# Patient Record
Sex: Male | Born: 1975 | Race: White | Hispanic: No | Marital: Single | Smoking: Never smoker
Health system: Southern US, Community
[De-identification: ages and names within clinical notes are randomized; demographics above are authoritative.]

## PROBLEM LIST (undated history)

## (undated) DIAGNOSIS — N200 Calculus of kidney: Secondary | ICD-10-CM

---

## 2016-04-12 ENCOUNTER — Emergency Department (HOSPITAL_COMMUNITY): Payer: BLUE CROSS/BLUE SHIELD

## 2016-04-12 ENCOUNTER — Emergency Department (HOSPITAL_COMMUNITY)
Admission: EM | Admit: 2016-04-12 | Discharge: 2016-04-12 | Disposition: A | Discharge status: Home / Self Care | Payer: BLUE CROSS/BLUE SHIELD | Attending: Emergency Medicine | Admitting: Emergency Medicine

## 2016-04-12 ENCOUNTER — Encounter (HOSPITAL_COMMUNITY): Payer: Self-pay | Admitting: Emergency Medicine

## 2016-04-12 DIAGNOSIS — Z79899 Other long term (current) drug therapy: Secondary | ICD-10-CM | POA: Insufficient documentation

## 2016-04-12 DIAGNOSIS — N200 Calculus of kidney: Secondary | ICD-10-CM | POA: Diagnosis not present

## 2016-04-12 DIAGNOSIS — R109 Unspecified abdominal pain: Secondary | ICD-10-CM | POA: Diagnosis present

## 2016-04-12 HISTORY — DX: Calculus of kidney: N20.0

## 2016-04-12 LAB — URINALYSIS, ROUTINE W REFLEX MICROSCOPIC
Bilirubin Urine: NEGATIVE
GLUCOSE, UA: NEGATIVE mg/dL
Ketones, ur: NEGATIVE mg/dL
LEUKOCYTES UA: NEGATIVE
Nitrite: NEGATIVE
PROTEIN: NEGATIVE mg/dL
SPECIFIC GRAVITY, URINE: 1.029 (ref 1.005–1.030)
pH: 6.5 (ref 5.0–8.0)

## 2016-04-12 LAB — URINE MICROSCOPIC-ADD ON

## 2016-04-12 MED ORDER — KETOROLAC TROMETHAMINE 30 MG/ML IJ SOLN
30.0000 mg | Freq: Once | INTRAMUSCULAR | Status: AC
  Administered 2016-04-12: 30 mg via INTRAVENOUS
  Filled 2016-04-12: qty 1

## 2016-04-12 MED ORDER — OXYCODONE-ACETAMINOPHEN 5-325 MG PO TABS: 1.0000 | ORAL_TABLET | Freq: Three times a day (TID) | ORAL | 0 refills | Status: AC | PRN

## 2016-04-12 MED ORDER — SODIUM CHLORIDE 0.9 % IV BOLUS (SEPSIS)
1000.0000 mL | Freq: Once | INTRAVENOUS | Status: AC
  Administered 2016-04-12: 1000 mL via INTRAVENOUS

## 2016-04-12 MED ORDER — MORPHINE SULFATE (PF) 4 MG/ML IV SOLN
4.0000 mg | Freq: Once | INTRAVENOUS | Status: AC
  Administered 2016-04-12: 4 mg via INTRAVENOUS
  Filled 2016-04-12: qty 1

## 2016-04-12 NOTE — ED Triage Notes (Signed)
Pt reports L flank pain radiating to groin. Hx of kidney stones 13 years ago, feels like same. Having difficulty urinating, but hematuria.

## 2016-04-12 NOTE — Discharge Instructions (Signed)
Please read and follow all provided instructions.  Your diagnoses today include:  1. Left nephrolithiasis    Tests performed today include: Urine test that showed blood in your urine and no infection CT scan which showed a 3 millimeter kidney stone on the left side Vital signs. See below for your results today.   Medications prescribed:   Take any prescribed medications only as directed.  Home care instructions:  Follow any educational materials contained in this packet.  Please double your fluid intake for the next several days. Strain your urine and save any stones that may pass.   BE VERY CAREFUL not to take multiple medicines containing Tylenol (also called acetaminophen). Doing so can lead to an overdose which can damage your liver and cause liver failure and possibly death.   Follow-up instructions: Please follow-up with your urologist or the urologist referral (provided on front page) in the next 1 week for further evaluation of your symptoms.  If you need to return to the Emergency Department, go to [facility] and not [facility]. The [facility]rologists are located at [facility] and can better care for you at this location.  Return instructions:  If you need to return to the Emergency Department, go to [facility] and not [facility]. The [facility]rologists are located at [facility] and can better care for you at this location.  Please return to the Emergency Department if you experience worsening symptoms.  Please return if you develop fever or uncontrolled pain or vomiting. Please return if you have any other emergent concerns.  Additional Information:  Your vital signs today were: BP (!) 174/108 (BP Location: Right Arm)    Pulse 98    Temp 98.2 F (36.8 C) (Oral)    Resp 18    SpO2 100%  If your blood pressure (BP) was elevated above 135/85 this visit, please have this repeated by your doctor within one month. --------------

## 2016-04-12 NOTE — ED Notes (Signed)
PA at bedside.

## 2016-04-12 NOTE — ED Provider Notes (Signed)
[facility]-EMERGENCY DEPT Provider Note   CSN: [id number] Arrival date & time: 04/12/16  1504  History   Chief Complaint Chief Complaint  Patient presents with  . Flank Pain    HPI Caleb Hudson is a 40 y.o. male.  HPI  40 y.o. male with a hx of Kidney Stones, presents to the Emergency Department today complaining of left flank pain this AM. Notes pain is intermittent in nature and rates 5/10. Notes hx similar with stones in past. Has radiation into suprapubic region. Notes decrease urine output as well as minimal hematuria. No fevers. No CP/SOB/ABD pain. No dysuria. Notes nausea and 1 episode of emesis due to pain. Has not taken any OTC remedies. No other symptoms noted.   Past Medical History:  Diagnosis Date  . Kidney stone     There are no active problems to display for this patient.   History reviewed. No pertinent surgical history.     Home Medications    Prior to Admission medications   Not on File    Family History History reviewed. No pertinent family history.  Social History Social History  Substance Use Topics  . Smoking status: Never Smoker  . Smokeless tobacco: Never Used  . Alcohol use No     Allergies   Penicillins   Review of Systems Review of Systems ROS reviewed and all are negative for acute change except as noted in the HPI.  Physical Exam Updated Vital Signs BP (!) 174/108 (BP Location: Right Arm)   Pulse 98   Temp 98.2 F (36.8 C) (Oral)   Resp 18   SpO2 100%   Physical Exam  Constitutional: He is oriented to person, place, and time. Vital signs are normal. He appears well-developed and well-nourished.  HENT:  Head: Normocephalic.  Right Ear: Hearing normal.  Left Ear: Hearing normal.  Eyes: Conjunctivae and EOM are normal. Pupils are equal, round, and reactive to light.  Neck: Normal range of motion. Neck supple.  Cardiovascular: Normal rate and regular rhythm.   Pulmonary/Chest: Effort normal.  Abdominal: Soft. Normal  appearance and bowel sounds are normal. There is no tenderness. There is no rigidity, no rebound, no guarding, no CVA tenderness, no tenderness at McBurney's point and negative Murphy's sign.  Musculoskeletal: Normal range of motion.  Neurological: He is alert and oriented to person, place, and time.  Skin: Skin is warm and dry.  Psychiatric: He has a normal mood and affect. His speech is normal and behavior is normal. Thought content normal.  Nursing note and vitals reviewed.  ED Treatments / Results  Labs (all labs ordered are listed, but only abnormal results are displayed) Labs Reviewed  URINALYSIS, ROUTINE W REFLEX MICROSCOPIC (NOT AT [facility]) - Abnormal; Notable for the following:       Result Value   Hgb urine dipstick SMALL (*)    All other components within normal limits  URINE MICROSCOPIC-ADD ON - Abnormal; Notable for the following:    Squamous Epithelial / LPF 0-5 (*)    Bacteria, UA RARE (*)    All other components within normal limits   EKG  EKG Interpretation None      Radiology Ct Renal Stone Study  Result Date: 04/12/2016 CLINICAL DATA:  Left flank pain EXAM: CT ABDOMEN AND PELVIS WITHOUT CONTRAST TECHNIQUE: Multidetector CT imaging of the abdomen and pelvis was performed following the standard protocol without IV contrast. COMPARISON:  02/28/2002 FINDINGS: Lower chest: Lung bases are unremarkable. Hepatobiliary: Unenhanced liver shows no biliary ductal dilatation.  No calcified gallstones are noted within gallbladder. Pancreas: Unremarkable. No pancreatic ductal dilatation or surrounding inflammatory changes. Spleen: Normal in size without focal abnormality. Adrenals/Urinary Tract: No adrenal gland mass. There is mild left hydronephrosis minimal left hydroureter. Bilateral no nephrolithiasis. No proximal or mid ureteral calcified calculi are noted bilaterally. There is mild left perinephric and periureteral stranding. Axial image 82 there is 3 mm calcified calculus in left  UVJ/ urinary bladder wall. Limited assessment of urinary bladder which is empty. Stomach/Bowel: No small bowel obstruction. No thickened or dilated small bowel loops. Terminal ileum is unremarkable. No pericecal inflammation. Normal appendix. No distal colonic obstruction. No evidence of colitis or diverticulitis. Vascular/Lymphatic: No aortic aneurysm. No retroperitoneal or mesenteric adenopathy. Reproductive: Prostate gland is unremarkable. Other: No ascites or free abdominal air. Musculoskeletal: No destructive bony lesions are noted. Sagittal images of the spine are unremarkable. Probable hemangioma L3 vertebral body. IMPRESSION: 1. There is mild left hydronephrosis and minimal left hydroureter. Mild left perinephric stranding. 2. There is 3 mm calcified partially obstructive calculus in left UVJ/urinary bladder wall. 3. No right hydronephrosis. 4. Normal appendix.  No pericecal inflammation. 5. No small bowel obstruction. Electronically Signed   By: Natasha Mead M.D.   On: 04/12/2016 16:33    Procedures Procedures (including critical care time)  Medications Ordered in ED Medications - No data to display   Initial Impression / Assessment and Plan / ED Course  I have reviewed the triage vital signs and the nursing notes.  Pertinent labs & imaging results that were available during my care of the patient were reviewed by me and considered in my medical decision making (see chart for details).  Clinical Course   Final Clinical Impressions(s) / ED Diagnoses  I have reviewed and evaluated the relevant laboratory values I have reviewed and evaluated the relevant imaging studies.  I have reviewed the relevant previous healthcare records.I obtained HPI from historian.  ED Course:  Assessment: Pt is a 40yM with hx Kidney stones who presents with left flank pain this AM. Notes hx same with stones. Notes colicky pain with radiation into groin. No dysuria. Does endorse decrease urine output. On exam, pt  in NAD. Nontoxic/nonseptic appearing. VSS. Afebrile. Lungs CTA. Heart RRR. Abdomen nontender soft. UA shows small Hgb. CT renal ordered to evaluate for obstructing stone, which showed mild left hydronephrosis and minimal left hydroureter with mild left perinephric stranding. 3mm partially obstructive calculus noted on left UVJ/urinary bladder wall. Given fluids and analgesia in ED. Able to urine in ED without difficulty. Plan is to DC home with follow up to Urology if symptoms persist. Given Rx analgesia. At time of discharge, Patient is in no acute distress. Vital Signs are stable. Patient is able to ambulate. Patient able to tolerate PO.    Disposition/Plan:  DC Home Additional Verbal discharge instructions given and discussed with patient.  Pt Instructed to f/u with PCP in the next week for evaluation and treatment of symptoms. Return precautions given Pt acknowledges and agrees with plan  Supervising Physician Lyndal Pulley, MD   Final diagnoses:  Left nephrolithiasis    New Prescriptions New Prescriptions   No medications on file     Audry Pili, PA-C 04/12/16 1650    Lyndal Pulley, MD 04/13/16 [id number]

## 2016-04-12 NOTE — ED Notes (Signed)
Patient transported to CT 

## 2017-07-03 IMAGING — CT CT RENAL STONE PROTOCOL
2 of 3 series · 16 of 46 positions shown, 18 images · non-contrast
Comparison: 02/28/2002

CLINICAL DATA: Left flank pain

EXAM:
CT ABDOMEN AND PELVIS WITHOUT CONTRAST
TECHNIQUE: Multidetector CT imaging of the abdomen and pelvis was performed
following the standard protocol without IV contrast.

[Series 3: coronal · coronal · 0.84mm/px · 3 of 171 slices shown]
[im 57/171  soft-tissue]
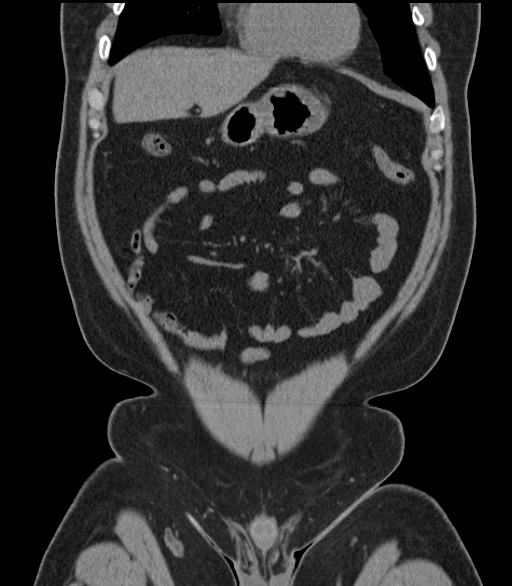
[im 76/171  soft-tissue]
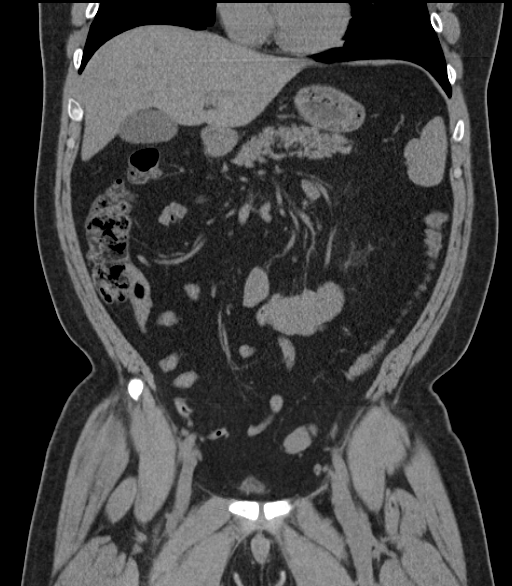
[im 95/171  soft-tissue]
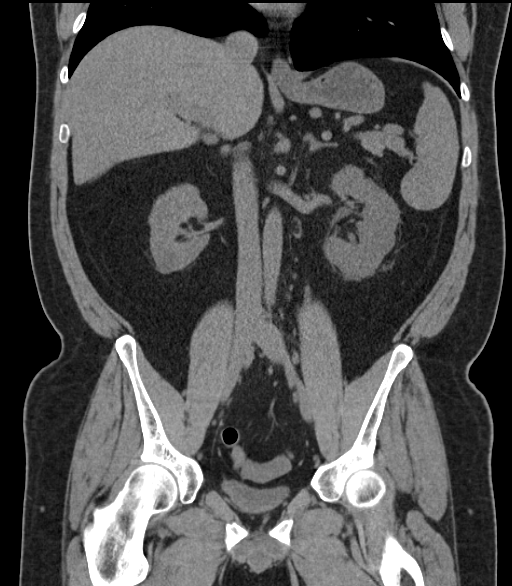

[Series 6: lung · axial · 0.82mm/px · z∈[+1211,+1397]mm · 13 of 107 slices shown, 15 images]
[im 7/107  soft-tissue]
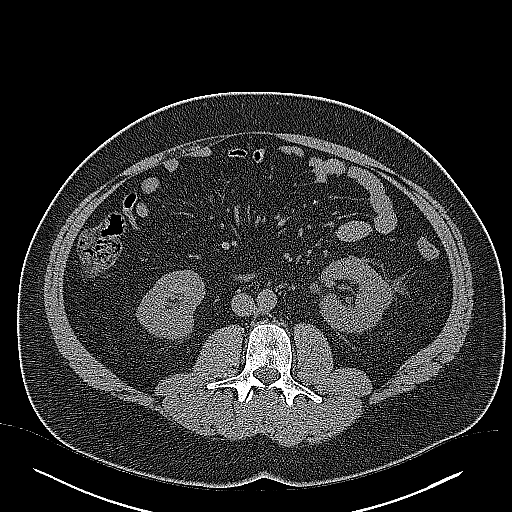
[im 7/107  bone]
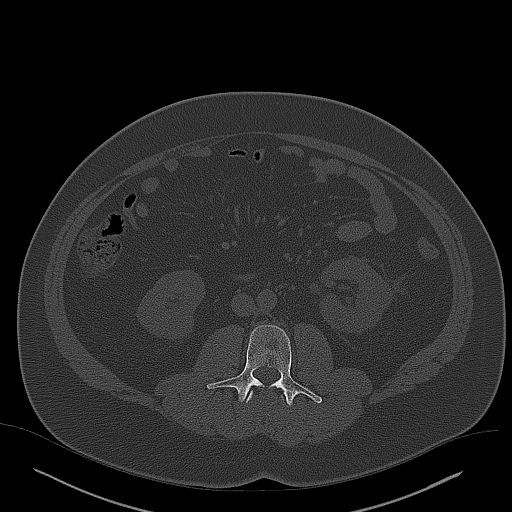
[im 14/107  soft-tissue]
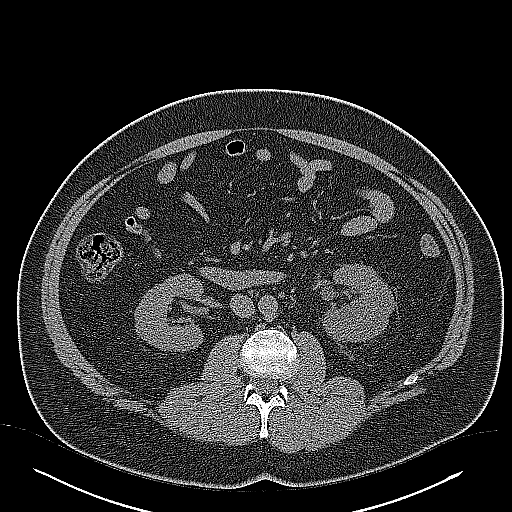
[im 21/107  soft-tissue]
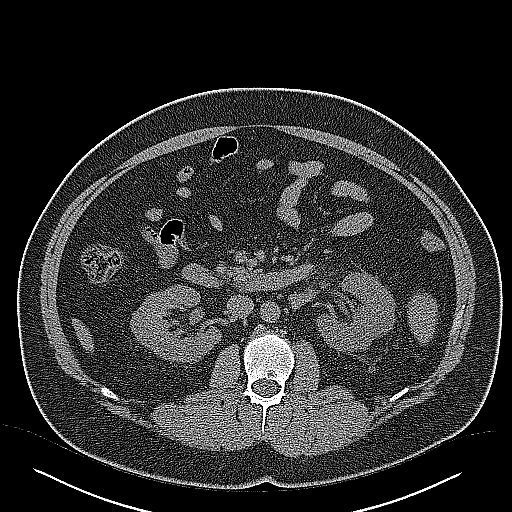
[im 31/107  soft-tissue]
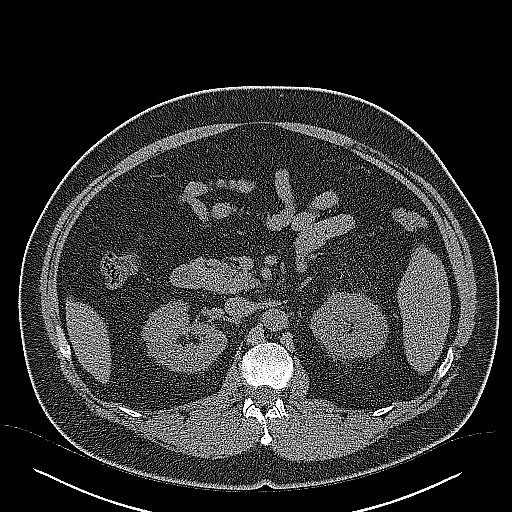
[im 38/107  soft-tissue]
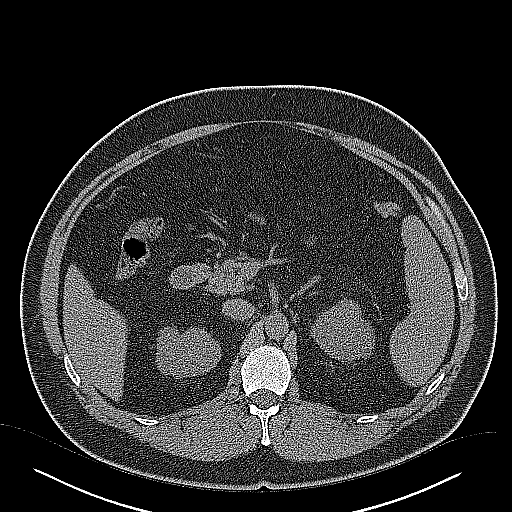
[im 45/107  soft-tissue]
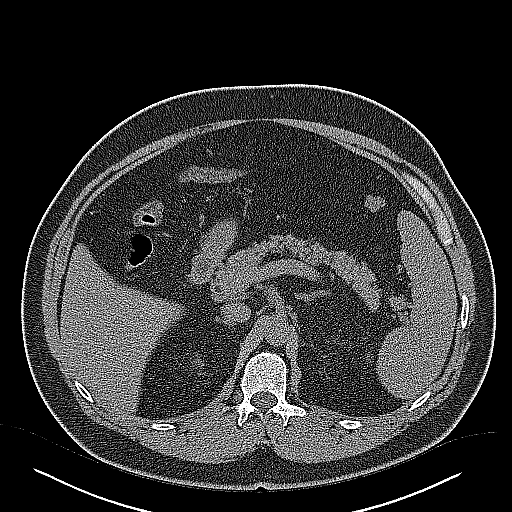
[im 55/107  soft-tissue]
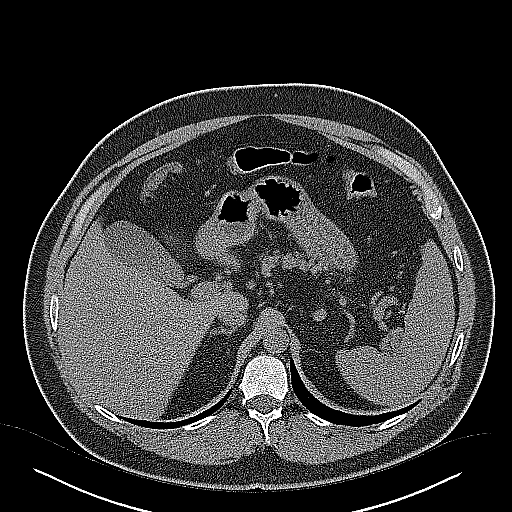
[im 62/107  soft-tissue]
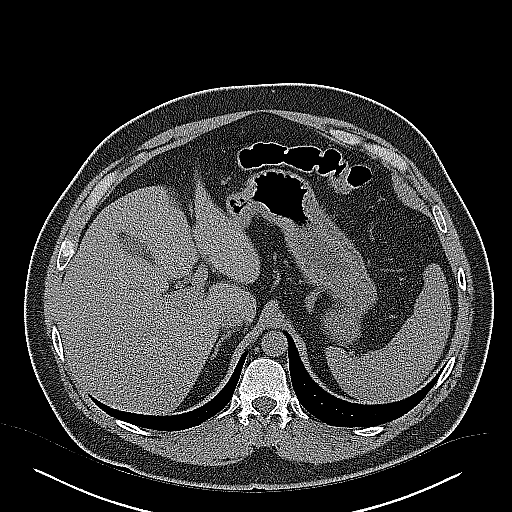
[im 69/107  soft-tissue]
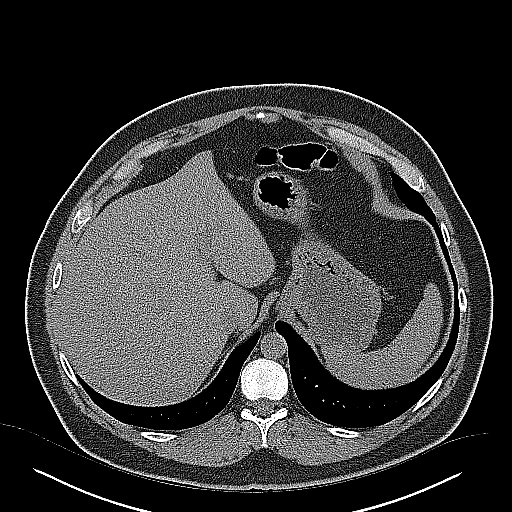
[im 69/107  bone]
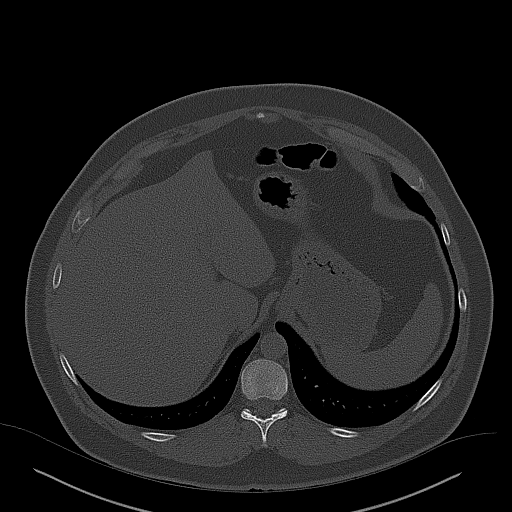
[im 76/107  soft-tissue]
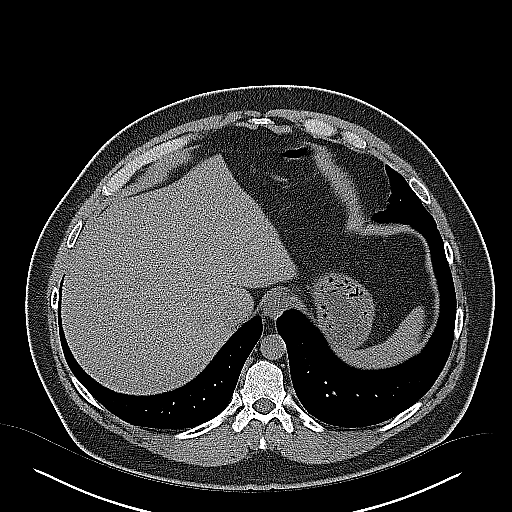
[im 86/107  soft-tissue]
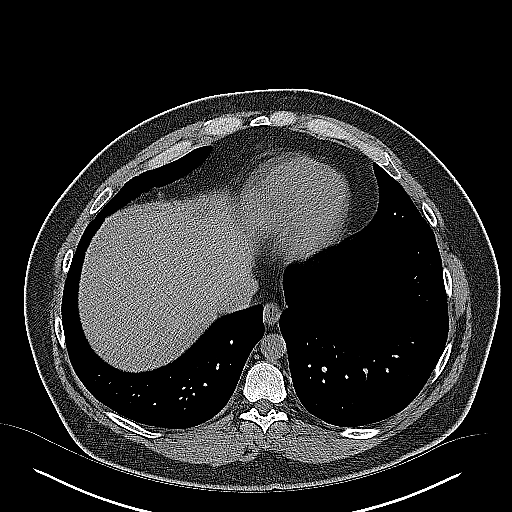
[im 93/107  soft-tissue]
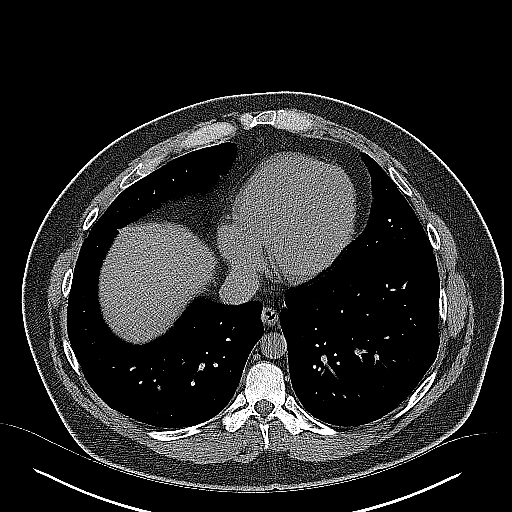
[im 100/107  soft-tissue]
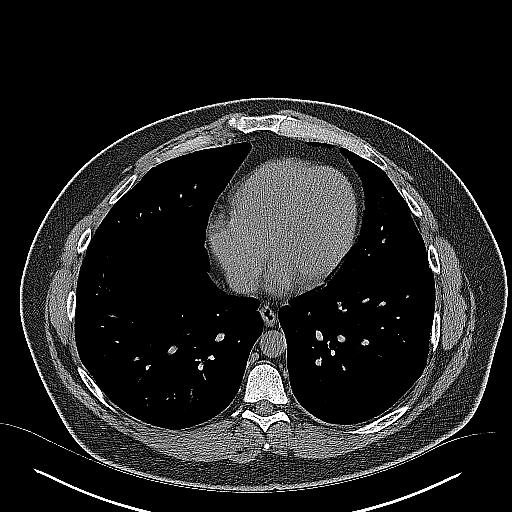

[16 of 46 positions shown; findings below may reference images not displayed]

FINDINGS: Lower chest: Lung bases are unremarkable.

Hepatobiliary: Unenhanced liver shows no biliary ductal dilatation.
No calcified gallstones are noted within gallbladder.

Pancreas: Unremarkable. No pancreatic ductal dilatation or
surrounding inflammatory changes.

Spleen: Normal in size without focal abnormality.

Adrenals/Urinary Tract: No adrenal gland mass. There is mild left
hydronephrosis minimal left hydroureter. Bilateral no
nephrolithiasis. No proximal or mid ureteral calcified calculi are
noted bilaterally. There is mild left perinephric and periureteral
stranding.

Axial image 82 there is 3 mm calcified calculus in left UVJ/ urinary
bladder wall. Limited assessment of urinary bladder which is empty.

Stomach/Bowel: No small bowel obstruction. No thickened or dilated
small bowel loops. Terminal ileum is unremarkable. No pericecal
inflammation. Normal appendix. No distal colonic obstruction. No
evidence of colitis or diverticulitis.

Vascular/Lymphatic: No aortic aneurysm. No retroperitoneal or
mesenteric adenopathy.

Reproductive: Prostate gland is unremarkable.

Other: No ascites or free abdominal air.

Musculoskeletal: No destructive bony lesions are noted. Sagittal
images of the spine are unremarkable. Probable hemangioma L3
vertebral body.
IMPRESSION: 1. There is mild left hydronephrosis and minimal left hydroureter.
Mild left perinephric stranding.
2. There is 3 mm calcified partially obstructive calculus in left
UVJ/urinary bladder wall.
3. No right hydronephrosis.
4. Normal appendix.  No pericecal inflammation.
5. No small bowel obstruction.
